# Patient Record
Sex: Female | Born: 1975 | Race: White | Hispanic: No | Marital: Single | Smoking: Never smoker
Health system: Southern US, Community
[De-identification: ages and names within clinical notes are randomized; demographics above are authoritative.]

## PROBLEM LIST (undated history)

## (undated) DIAGNOSIS — K219 Gastro-esophageal reflux disease without esophagitis: Secondary | ICD-10-CM

## (undated) DIAGNOSIS — K259 Gastric ulcer, unspecified as acute or chronic, without hemorrhage or perforation: Secondary | ICD-10-CM

## (undated) HISTORY — PX: CHOLECYSTECTOMY: SHX55

## (undated) HISTORY — PX: TUBAL LIGATION: SHX77

---

## 2017-08-03 ENCOUNTER — Other Ambulatory Visit: Payer: Self-pay

## 2017-08-03 ENCOUNTER — Encounter (HOSPITAL_BASED_OUTPATIENT_CLINIC_OR_DEPARTMENT_OTHER): Payer: Self-pay

## 2017-08-03 ENCOUNTER — Emergency Department (HOSPITAL_BASED_OUTPATIENT_CLINIC_OR_DEPARTMENT_OTHER)
Admission: EM | Admit: 2017-08-03 | Discharge: 2017-08-04 | Disposition: A | Discharge status: Home / Self Care | Payer: Medicaid Other | Attending: Emergency Medicine | Admitting: Emergency Medicine

## 2017-08-03 ENCOUNTER — Emergency Department (HOSPITAL_BASED_OUTPATIENT_CLINIC_OR_DEPARTMENT_OTHER): Payer: Medicaid Other

## 2017-08-03 DIAGNOSIS — W0110XA Fall on same level from slipping, tripping and stumbling with subsequent striking against unspecified object, initial encounter: Secondary | ICD-10-CM | POA: Insufficient documentation

## 2017-08-03 DIAGNOSIS — S80212A Abrasion, left knee, initial encounter: Secondary | ICD-10-CM | POA: Diagnosis not present

## 2017-08-03 DIAGNOSIS — S99912A Unspecified injury of left ankle, initial encounter: Secondary | ICD-10-CM | POA: Diagnosis present

## 2017-08-03 DIAGNOSIS — Y999 Unspecified external cause status: Secondary | ICD-10-CM | POA: Insufficient documentation

## 2017-08-03 DIAGNOSIS — S93402A Sprain of unspecified ligament of left ankle, initial encounter: Secondary | ICD-10-CM | POA: Diagnosis not present

## 2017-08-03 DIAGNOSIS — Y929 Unspecified place or not applicable: Secondary | ICD-10-CM | POA: Insufficient documentation

## 2017-08-03 DIAGNOSIS — W19XXXA Unspecified fall, initial encounter: Secondary | ICD-10-CM

## 2017-08-03 DIAGNOSIS — Y9301 Activity, walking, marching and hiking: Secondary | ICD-10-CM | POA: Insufficient documentation

## 2017-08-03 HISTORY — DX: Gastric ulcer, unspecified as acute or chronic, without hemorrhage or perforation: K25.9

## 2017-08-03 HISTORY — DX: Gastro-esophageal reflux disease without esophagitis: K21.9

## 2017-08-03 NOTE — Discharge Instructions (Addendum)
Rest.  Elevate your ankle as much as possible for the next several days.  Ice for 20 minutes every 2 hours while awake for the next 2 days.  Wear Ace bandage for comfort and support.  Local wound care with bacitracin and dressing changes twice daily.  Follow-up with your primary doctor if symptoms are not improving in the next week.

## 2017-08-03 NOTE — ED Triage Notes (Signed)
Pt slipped/fell approx 3pm-pain to right ankle and left knee-to triage in w/c-pt drove self here-NAD

## 2017-08-03 NOTE — ED Notes (Signed)
Pt pleasantly refused xray to knee states she prefers ankle xray only

## 2017-08-03 NOTE — ED Provider Notes (Signed)
[facility] EMERGENCY DEPARTMENT Provider Note   CSN: [id number] Arrival date & time: 08/03/17  2209     History   Chief Complaint Chief Complaint  Patient presents with  . Fall    HPI Tiffany Fischer is a 42 y.o. female.  Patient is a 42 year old female presenting with complaints of a fall.  She states tripping outside of her mother's home and falling on the concrete.  She reports inverting her right ankle, then landing on her left knee.  She has pain in her right ankle and an abrasion to the knee.  She has been ambulatory today, however her ankle is becoming more painful.  She is not concerned about a sprain or fracture in her knee and is declining x-rays.  The history is provided by the patient.  Fall  This is a new problem. Episode onset: This afternoon. The problem occurs constantly. The problem has not changed since onset.The symptoms are aggravated by walking. Nothing relieves the symptoms. She has tried nothing for the symptoms.    Past Medical History:  Diagnosis Date  . Gastric ulcer   . GERD (gastroesophageal reflux disease)     There are no active problems to display for this patient.   Past Surgical History:  Procedure Laterality Date  . CESAREAN SECTION    . CHOLECYSTECTOMY    . TUBAL LIGATION       OB History   None      Home Medications    Prior to Admission medications   Not on File    Family History No family history on file.  Social History Social History   Tobacco Use  . Smoking status: Never Smoker  . Smokeless tobacco: Never Used  Substance Use Topics  . Alcohol use: Never    Frequency: Never  . Drug use: Yes    Types: Marijuana     Allergies   Patient has no known allergies.   Review of Systems Review of Systems  All other systems reviewed and are negative.    Physical Exam Updated Vital Signs BP 121/82 (BP Location: Right Arm)   Pulse 94   Temp 98.7 F (37.1 C) (Oral)   Resp 20   Ht 5\' 7"  (1.702 m)    Wt (!) 143.3 kg (316 lb)   SpO2 95%   BMI 49.49 kg/m   Physical Exam  Constitutional: She is oriented to person, place, and time. She appears well-developed and well-nourished. No distress.  HENT:  Head: Normocephalic and atraumatic.  Neck: Normal range of motion. Neck supple.  Pulmonary/Chest: Effort normal.  Musculoskeletal:  There is swelling and tenderness of the soft tissues around the lateral malleolus.  There is no obvious deformity.  Ankle is stable and has good range of motion.  There is no proximal fibular tenderness and no fifth metatarsal tenderness.  There is a moderate to large abrasion to the patella of the left knee.  There is no effusion and she has good range of motion.  Neurological: She is alert and oriented to person, place, and time.  Skin: Skin is warm and dry. She is not diaphoretic.  Nursing note and vitals reviewed.    ED Treatments / Results  Labs (all labs ordered are listed, but only abnormal results are displayed) Labs Reviewed - No data to display  EKG None  Radiology Dg Ankle Complete Right  Result Date: 08/03/2017 CLINICAL DATA:  42 year old female with fall and right ankle pain. EXAM: RIGHT ANKLE - COMPLETE 3+  VIEW COMPARISON:  None. FINDINGS: There is no evidence of fracture, dislocation, or joint effusion. There is no evidence of arthropathy or other focal bone abnormality. Soft tissues are unremarkable. IMPRESSION: Negative. Electronically Signed   By: Elgie Collard M.D.   On: 08/03/2017 23:12    Procedures Procedures (including critical care time)  Medications Ordered in ED Medications - No data to display   Initial Impression / Assessment and Plan / ED Course  I have reviewed the triage vital signs and the nursing notes.  Pertinent labs & imaging results that were available during my care of the patient were reviewed by me and considered in my medical decision making (see chart for details).  X-rays of the ankle are negative.   This will be treated as a sprain.  Local wound care will be performed and dressing applied to the left knee.  Final Clinical Impressions(s) / ED Diagnoses   Final diagnoses:  None    ED Discharge Orders    None       Geoffery Lyons, MD 08/03/17 2328

## 2020-01-14 DIAGNOSIS — K2900 Acute gastritis without bleeding: Secondary | ICD-10-CM | POA: Diagnosis not present

## 2020-01-14 DIAGNOSIS — E876 Hypokalemia: Secondary | ICD-10-CM | POA: Diagnosis not present

## 2020-01-14 DIAGNOSIS — I517 Cardiomegaly: Secondary | ICD-10-CM | POA: Diagnosis not present

## 2020-02-05 DIAGNOSIS — L209 Atopic dermatitis, unspecified: Secondary | ICD-10-CM | POA: Diagnosis not present

## 2020-02-05 DIAGNOSIS — L089 Local infection of the skin and subcutaneous tissue, unspecified: Secondary | ICD-10-CM | POA: Diagnosis not present

## 2020-02-11 IMAGING — DX DG ANKLE COMPLETE 3+V*R*
3 series · 3 of 3 positions shown · non-contrast
Comparison: None.

CLINICAL DATA: 41-year-old female with fall and right ankle pain.

EXAM:
RIGHT ANKLE - COMPLETE 3+ VIEW

[ankle ap]
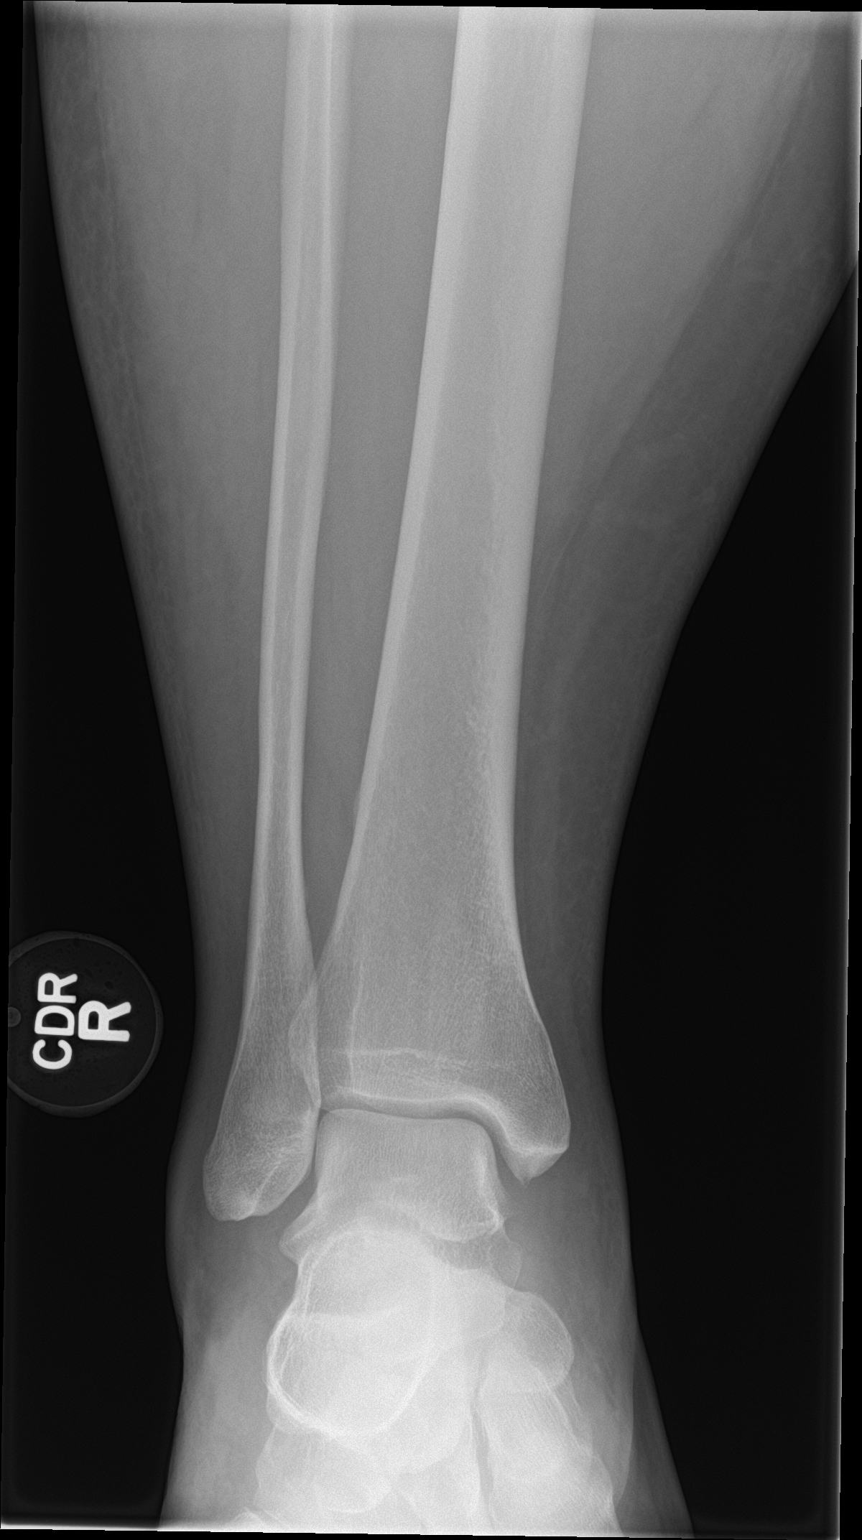

[ankle obl]
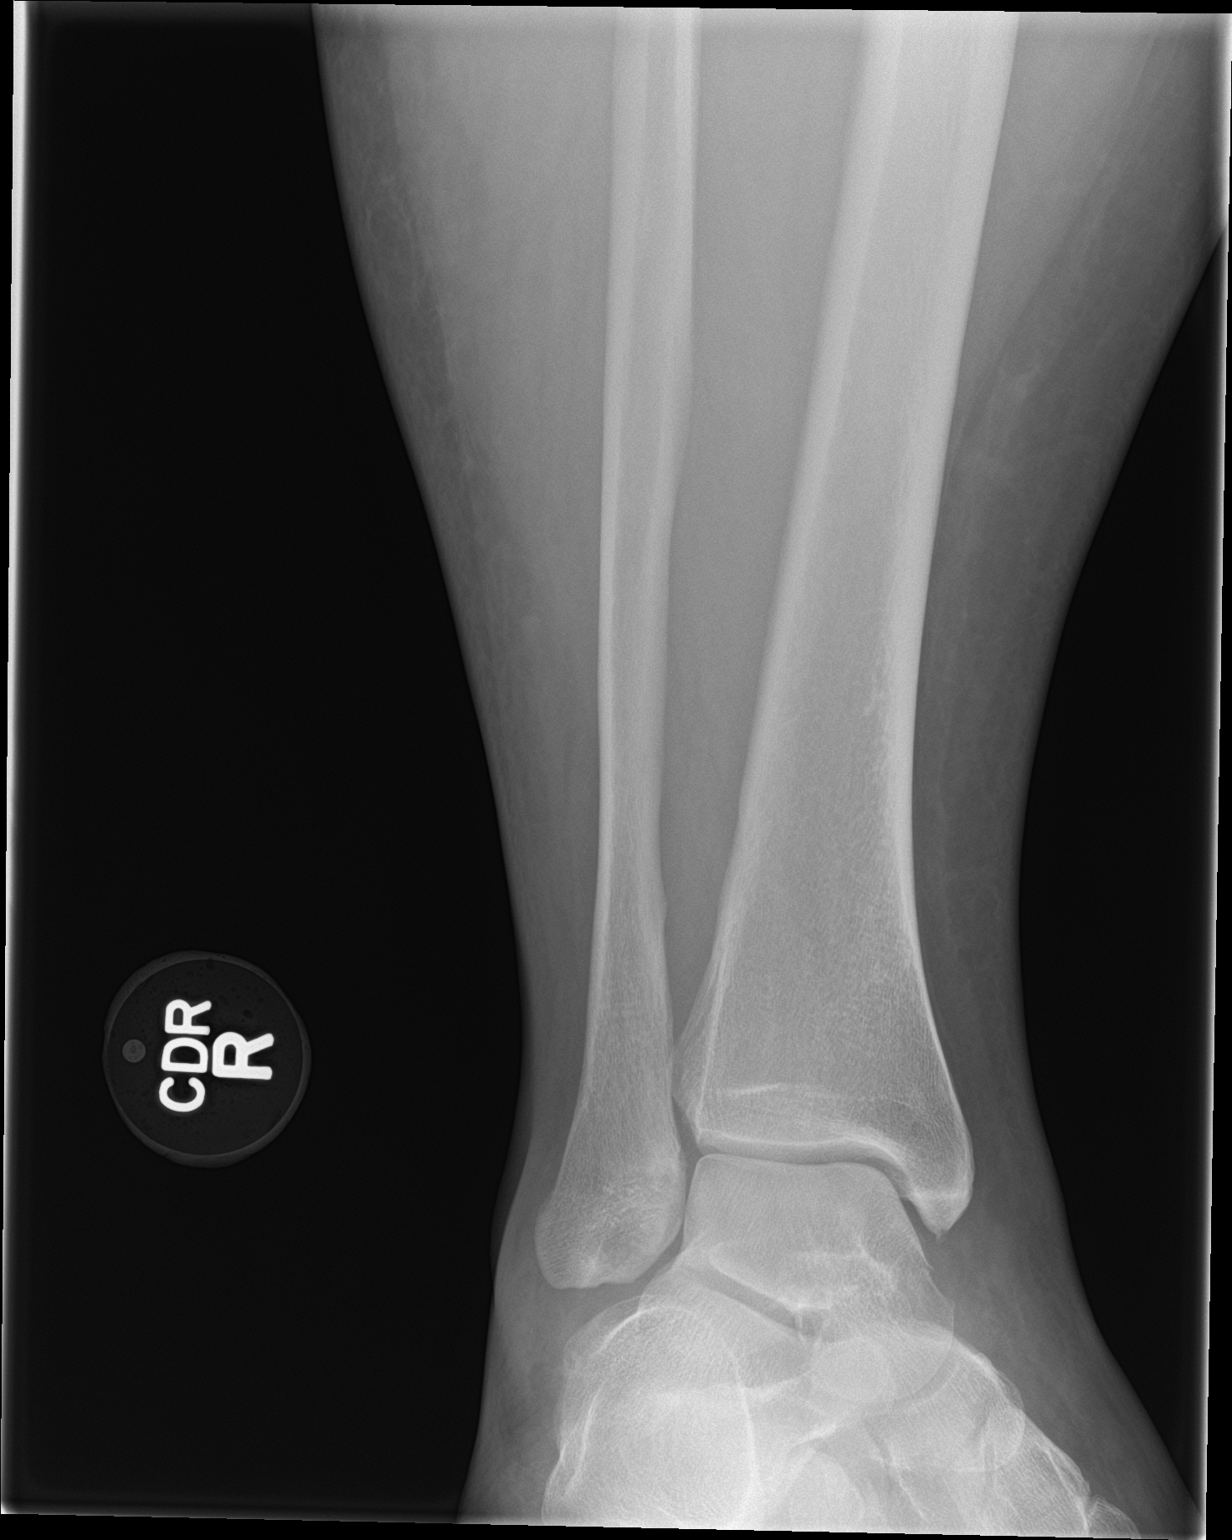

[ankle lat]
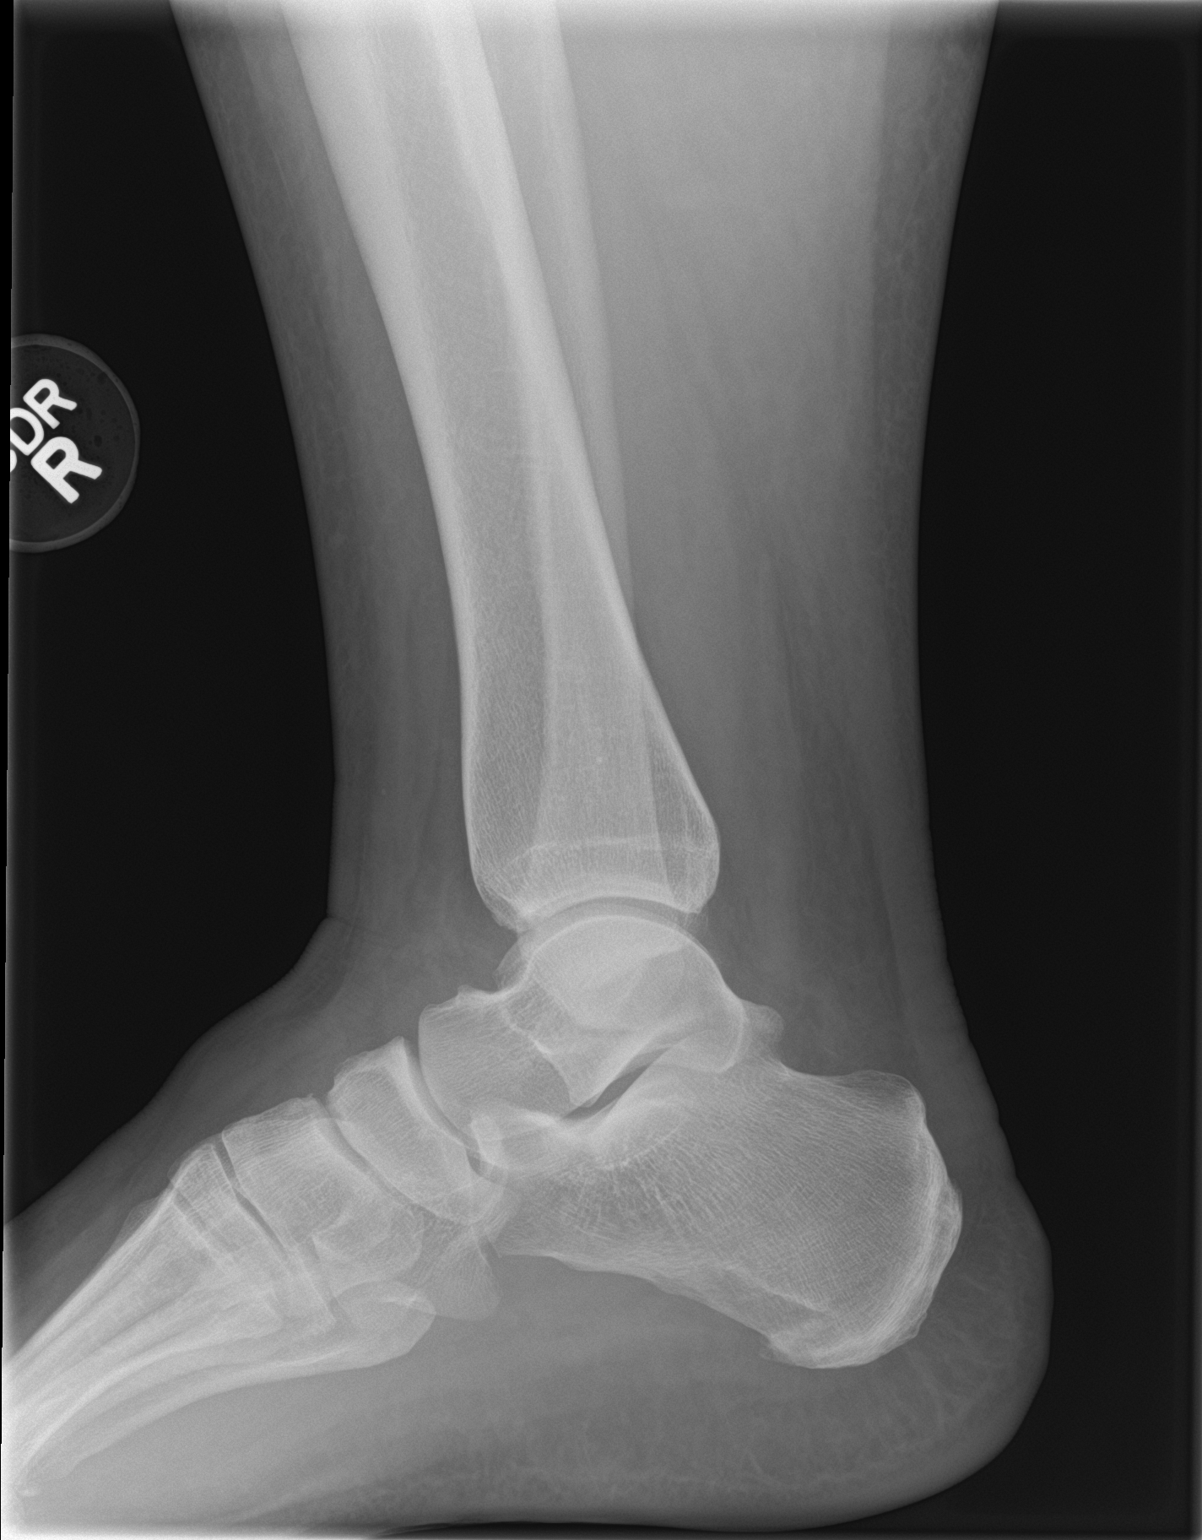

[3 of 3 positions shown; findings below may reference images not displayed]

FINDINGS: There is no evidence of fracture, dislocation, or joint effusion.
There is no evidence of arthropathy or other focal bone abnormality.
Soft tissues are unremarkable.
IMPRESSION: Negative.

## 2020-03-09 DIAGNOSIS — N390 Urinary tract infection, site not specified: Secondary | ICD-10-CM | POA: Diagnosis not present

## 2020-03-09 DIAGNOSIS — K219 Gastro-esophageal reflux disease without esophagitis: Secondary | ICD-10-CM | POA: Diagnosis not present

## 2020-03-09 DIAGNOSIS — B9689 Other specified bacterial agents as the cause of diseases classified elsewhere: Secondary | ICD-10-CM | POA: Diagnosis not present

## 2020-03-09 DIAGNOSIS — Z79899 Other long term (current) drug therapy: Secondary | ICD-10-CM | POA: Diagnosis not present

## 2020-03-10 ENCOUNTER — Telehealth: Payer: Self-pay

## 2020-03-10 NOTE — Telephone Encounter (Signed)
Transition Care Management Unsuccessful Follow-up Telephone Call  Date of discharge and from where:  03/09/2020 from [facility]  Attempts:  1st Attempt  Reason for unsuccessful TCM follow-up call:  Unable to leave message

## 2020-03-11 NOTE — Telephone Encounter (Signed)
Transition Care Management Follow-up Telephone Call  Date of discharge and from where: 03/09/2020 from [facility]  How have you been since you were released from the hospital? Pt states that she is feeling much better since starting the antibiotic. Pt stated that she was in hurry and having a lot of things going on today and needed to go before all questions could be answered.   Any questions or concerns? No  Items Reviewed:  Did the pt receive and understand the discharge instructions provided? Yes   Medications obtained and verified? No   Other? No   Any new allergies since your discharge? No   Dietary orders reviewed? no  Do you have support at home? Yes    Functional Questionnaire: (I = Independent and D = Dependent) ADLs: I  Bathing/Dressing- I  Meal Prep- I  Eating- I  Maintaining continence- I  Transferring/Ambulation- I  Managing Meds- I   Follow up appointments reviewed:   PCP Hospital f/u appt confirmed? No    Specialist Hospital f/u appt confirmed? No    Are transportation arrangements needed? No   If their condition worsens, is the pt aware to call PCP or go to the Emergency Dept.? Yes Was the patient provided with contact information for the PCP's office or ED? Yes Was to pt encouraged to call back with questions or concerns? Yes

## 2020-12-17 DIAGNOSIS — F411 Generalized anxiety disorder: Secondary | ICD-10-CM | POA: Diagnosis not present

## 2020-12-17 DIAGNOSIS — K219 Gastro-esophageal reflux disease without esophagitis: Secondary | ICD-10-CM | POA: Diagnosis not present

## 2021-10-26 DIAGNOSIS — R399 Unspecified symptoms and signs involving the genitourinary system: Secondary | ICD-10-CM | POA: Diagnosis not present

## 2021-10-26 DIAGNOSIS — Z113 Encounter for screening for infections with a predominantly sexual mode of transmission: Secondary | ICD-10-CM | POA: Diagnosis not present

## 2021-10-26 DIAGNOSIS — A749 Chlamydial infection, unspecified: Secondary | ICD-10-CM | POA: Diagnosis not present

## 2021-10-26 DIAGNOSIS — B3731 Acute candidiasis of vulva and vagina: Secondary | ICD-10-CM | POA: Diagnosis not present

## 2021-10-26 DIAGNOSIS — B9689 Other specified bacterial agents as the cause of diseases classified elsewhere: Secondary | ICD-10-CM | POA: Diagnosis not present

## 2021-10-26 DIAGNOSIS — N760 Acute vaginitis: Secondary | ICD-10-CM | POA: Diagnosis not present

## 2021-10-26 DIAGNOSIS — R350 Frequency of micturition: Secondary | ICD-10-CM | POA: Diagnosis not present

## 2021-11-21 DIAGNOSIS — Z1151 Encounter for screening for human papillomavirus (HPV): Secondary | ICD-10-CM | POA: Diagnosis not present

## 2021-11-21 DIAGNOSIS — Z01419 Encounter for gynecological examination (general) (routine) without abnormal findings: Secondary | ICD-10-CM | POA: Diagnosis not present

## 2021-11-21 DIAGNOSIS — A599 Trichomoniasis, unspecified: Secondary | ICD-10-CM | POA: Diagnosis not present

## 2021-12-23 DIAGNOSIS — H5213 Myopia, bilateral: Secondary | ICD-10-CM | POA: Diagnosis not present

## 2022-04-09 DIAGNOSIS — R110 Nausea: Secondary | ICD-10-CM | POA: Diagnosis not present

## 2022-04-09 DIAGNOSIS — R52 Pain, unspecified: Secondary | ICD-10-CM | POA: Diagnosis not present

## 2022-04-09 DIAGNOSIS — K219 Gastro-esophageal reflux disease without esophagitis: Secondary | ICD-10-CM | POA: Diagnosis not present

## 2022-04-09 DIAGNOSIS — R1111 Vomiting without nausea: Secondary | ICD-10-CM | POA: Diagnosis not present

## 2022-05-02 DIAGNOSIS — N898 Other specified noninflammatory disorders of vagina: Secondary | ICD-10-CM | POA: Diagnosis not present

## 2022-05-02 DIAGNOSIS — R300 Dysuria: Secondary | ICD-10-CM | POA: Diagnosis not present
